# Patient Record
Sex: Male | Born: 1950 | Race: White | Hispanic: No | Marital: Married | Smoking: Never smoker
Health system: Southern US, Community
[De-identification: ages and names within clinical notes are randomized; demographics above are authoritative.]

## PROBLEM LIST (undated history)

## (undated) DIAGNOSIS — E7800 Pure hypercholesterolemia, unspecified: Secondary | ICD-10-CM

## (undated) DIAGNOSIS — K219 Gastro-esophageal reflux disease without esophagitis: Secondary | ICD-10-CM

---

## 2009-08-26 HISTORY — PX: AORTIC VALVE REPLACEMENT: SHX41

## 2014-05-14 ENCOUNTER — Emergency Department (HOSPITAL_BASED_OUTPATIENT_CLINIC_OR_DEPARTMENT_OTHER): Payer: Worker's Compensation

## 2014-05-14 ENCOUNTER — Encounter (HOSPITAL_BASED_OUTPATIENT_CLINIC_OR_DEPARTMENT_OTHER): Payer: Self-pay | Admitting: Emergency Medicine

## 2014-05-14 ENCOUNTER — Emergency Department (HOSPITAL_BASED_OUTPATIENT_CLINIC_OR_DEPARTMENT_OTHER)
Admission: EM | Admit: 2014-05-14 | Discharge: 2014-05-14 | Disposition: A | Discharge status: Home / Self Care | Payer: Worker's Compensation | Attending: Emergency Medicine | Admitting: Emergency Medicine

## 2014-05-14 DIAGNOSIS — Z7982 Long term (current) use of aspirin: Secondary | ICD-10-CM | POA: Insufficient documentation

## 2014-05-14 DIAGNOSIS — N509 Disorder of male genital organs, unspecified: Secondary | ICD-10-CM | POA: Insufficient documentation

## 2014-05-14 DIAGNOSIS — Z79899 Other long term (current) drug therapy: Secondary | ICD-10-CM | POA: Insufficient documentation

## 2014-05-14 DIAGNOSIS — Z862 Personal history of diseases of the blood and blood-forming organs and certain disorders involving the immune mechanism: Secondary | ICD-10-CM | POA: Diagnosis not present

## 2014-05-14 DIAGNOSIS — N50812 Left testicular pain: Secondary | ICD-10-CM

## 2014-05-14 DIAGNOSIS — R1031 Right lower quadrant pain: Secondary | ICD-10-CM | POA: Insufficient documentation

## 2014-05-14 DIAGNOSIS — Z8639 Personal history of other endocrine, nutritional and metabolic disease: Secondary | ICD-10-CM | POA: Insufficient documentation

## 2014-05-14 DIAGNOSIS — K219 Gastro-esophageal reflux disease without esophagitis: Secondary | ICD-10-CM | POA: Diagnosis not present

## 2014-05-14 HISTORY — DX: Gastro-esophageal reflux disease without esophagitis: K21.9

## 2014-05-14 HISTORY — DX: Pure hypercholesterolemia, unspecified: E78.00

## 2014-05-14 LAB — CBC WITH DIFFERENTIAL/PLATELET
BASOS ABS: 0.1 10*3/uL (ref 0.0–0.1)
Basophils Relative: 1 % (ref 0–1)
Eosinophils Absolute: 0.2 10*3/uL (ref 0.0–0.7)
Eosinophils Relative: 4 % (ref 0–5)
HCT: 40.8 % (ref 39.0–52.0)
Hemoglobin: 14.1 g/dL (ref 13.0–17.0)
LYMPHS PCT: 27 % (ref 12–46)
Lymphs Abs: 1.4 10*3/uL (ref 0.7–4.0)
MCH: 29.6 pg (ref 26.0–34.0)
MCHC: 34.6 g/dL (ref 30.0–36.0)
MCV: 85.5 fL (ref 78.0–100.0)
Monocytes Absolute: 0.6 10*3/uL (ref 0.1–1.0)
Monocytes Relative: 12 % (ref 3–12)
NEUTROS ABS: 2.8 10*3/uL (ref 1.7–7.7)
NEUTROS PCT: 56 % (ref 43–77)
PLATELETS: 192 10*3/uL (ref 150–400)
RBC: 4.77 MIL/uL (ref 4.22–5.81)
RDW: 12.6 % (ref 11.5–15.5)
WBC: 5.1 10*3/uL (ref 4.0–10.5)

## 2014-05-14 LAB — COMPREHENSIVE METABOLIC PANEL
ALT: 30 U/L (ref 0–53)
AST: 35 U/L (ref 0–37)
Albumin: 4.2 g/dL (ref 3.5–5.2)
Alkaline Phosphatase: 50 U/L (ref 39–117)
Anion gap: 12 (ref 5–15)
BILIRUBIN TOTAL: 0.3 mg/dL (ref 0.3–1.2)
BUN: 14 mg/dL (ref 6–23)
CALCIUM: 9.5 mg/dL (ref 8.4–10.5)
CHLORIDE: 100 meq/L (ref 96–112)
CO2: 27 meq/L (ref 19–32)
Creatinine, Ser: 1.3 mg/dL (ref 0.50–1.35)
GFR calc Af Amer: 66 mL/min — ABNORMAL LOW (ref >90)
GFR calc non Af Amer: 57 mL/min — ABNORMAL LOW (ref >90)
Glucose, Bld: 98 mg/dL (ref 70–99)
POTASSIUM: 4.6 meq/L (ref 3.7–5.3)
SODIUM: 139 meq/L (ref 137–147)
Total Protein: 7 g/dL (ref 6.0–8.3)

## 2014-05-14 LAB — URINALYSIS, ROUTINE W REFLEX MICROSCOPIC
Bilirubin Urine: NEGATIVE
Glucose, UA: NEGATIVE mg/dL
Hgb urine dipstick: NEGATIVE
Ketones, ur: NEGATIVE mg/dL
Leukocytes, UA: NEGATIVE
Nitrite: NEGATIVE
Protein, ur: NEGATIVE mg/dL
Specific Gravity, Urine: 1.016 (ref 1.005–1.030)
Urobilinogen, UA: 0.2 mg/dL (ref 0.0–1.0)
pH: 5.5 (ref 5.0–8.0)

## 2014-05-14 MED ORDER — MORPHINE SULFATE 4 MG/ML IJ SOLN
6.0000 mg | Freq: Once | INTRAMUSCULAR | Status: AC
  Administered 2014-05-14: 6 mg via INTRAMUSCULAR
  Filled 2014-05-14: qty 2

## 2014-05-14 MED ORDER — TRAMADOL HCL 50 MG PO TABS: 50.0000 mg | ORAL_TABLET | Freq: Four times a day (QID) | ORAL | Status: AC | PRN

## 2014-05-14 NOTE — ED Notes (Signed)
Patient transported to X-ray 

## 2014-05-14 NOTE — Discharge Instructions (Signed)
Abdominal Pain °Many things can cause abdominal pain. Usually, abdominal pain is not caused by a disease and will improve without treatment. It can often be observed and treated at home. Your health care provider will do a physical exam and possibly order blood tests and X-rays to help determine the seriousness of your pain. However, in many cases, more time must pass before a clear cause of the pain can be found. Before that point, your health care provider may not know if you need more testing or further treatment. °HOME CARE INSTRUCTIONS  °Monitor your abdominal pain for any changes. The following actions may help to alleviate any discomfort you are experiencing: °· Only take over-the-counter or prescription medicines as directed by your health care provider. °· Do not take laxatives unless directed to do so by your health care provider. °· Try a clear liquid diet (broth, tea, or water) as directed by your health care provider. Slowly move to a bland diet as tolerated. °SEEK MEDICAL CARE IF: °· You have unexplained abdominal pain. °· You have abdominal pain associated with nausea or diarrhea. °· You have pain when you urinate or have a bowel movement. °· You experience abdominal pain that wakes you in the night. °· You have abdominal pain that is worsened or improved by eating food. °· You have abdominal pain that is worsened with eating fatty foods. °· You have a fever. °SEEK IMMEDIATE MEDICAL CARE IF:  °· Your pain does not go away within 2 hours. °· You keep throwing up (vomiting). °· Your pain is felt only in portions of the abdomen, such as the right side or the left lower portion of the abdomen. °· You pass bloody or black tarry stools. °MAKE SURE YOU: °· Understand these instructions.   °· Will watch your condition.   °· Will get help right away if you are not doing well or get worse.   °Document Released: 05/22/2005 Document Revised: 08/17/2013 Document Reviewed: 04/21/2013 °[employer]® Patient Information  ©2015 [employer]. This information is not intended to replace advice given to you by your health care provider. Make sure you discuss any questions you have with your health care provider. ° °Pain of Unknown Etiology (Pain Without a Known Cause) °You have come to your caregiver because of pain. Pain can occur in any part of the body. Often there is not a definite cause. If your laboratory (blood or urine) work was normal and X-rays or other studies were normal, your caregiver may treat you without knowing the cause of the pain. An example of this is the headache. Most headaches are diagnosed by taking a history. This means your caregiver asks you questions about your headaches. Your caregiver determines a treatment based on your answers. Usually testing done for headaches is normal. Often testing is not done unless there is no response to medications. Regardless of where your pain is located today, you can be given medications to make you comfortable. If no physical cause of pain can be found, most cases of pain will gradually leave as suddenly as they came.  °If you have a painful condition and no reason can be found for the pain, it is important that you follow up with your caregiver. If the pain becomes worse or does not go away, it may be necessary to repeat tests and look further for a possible cause. °· Only take over-the-counter or prescription medicines for pain, discomfort, or fever as directed by your caregiver. °· For the protection of your privacy, test results   cannot be given over the phone. Make sure you receive the results of your test. Ask how these results are to be obtained if you have not been informed. It is your responsibility to obtain your test results. °· You may continue all activities unless the activities cause more pain. When the pain lessens, it is important to gradually resume normal activities. Resume activities by beginning slowly and gradually increasing the intensity and duration  of the activities or exercise. During periods of severe pain, bed rest may be helpful. Lie or sit in any position that is comfortable. °· Ice used for acute (sudden) conditions may be effective. Use a large plastic bag filled with ice and wrapped in a towel. This may provide pain relief. °· See your caregiver for continued problems. Your caregiver can help or refer you for exercises or physical therapy if necessary. °If you were given medications for your condition, do not drive, operate machinery or power tools, or sign legal documents for 24 hours. Do not drink alcohol, take sleeping pills, or take other medications that may interfere with treatment. °See your caregiver immediately if you have pain that is becoming worse and not relieved by medications. °Document Released: 05/07/2001 Document Revised: 06/02/2013 Document Reviewed: 08/12/2005 °[employer]® Patient Information ©2015 [employer]. This information is not intended to replace advice given to you by your health care provider. Make sure you discuss any questions you have with your health care provider. ° °

## 2014-05-14 NOTE — ED Notes (Signed)
Pt is back from [place], in bathroom to get urine sample

## 2014-05-14 NOTE — ED Notes (Signed)
Patient transported to Ultrasound 

## 2014-05-14 NOTE — ED Provider Notes (Signed)
CSN: [id number]     Arrival date & time 05/14/14  1400 History   First MD Initiated Contact with Patient 05/14/14 1457     Chief Complaint  Patient presents with  . Testicle Pain  . Abdominal Pain     (Consider location/radiation/quality/duration/timing/severity/associated sxs/prior Treatment) Patient is a 63 y.o. male presenting with testicular pain and abdominal pain. The history is provided by the patient. No language interpreter was used.  Testicle Pain This is a new problem. The current episode started 12 to 24 hours ago. The problem occurs constantly. The problem has been gradually worsening. Associated symptoms include abdominal pain. Pertinent negatives include no chest pain, no headaches and no shortness of breath. Nothing aggravates the symptoms. Nothing relieves the symptoms. He has tried nothing for the symptoms. The treatment provided no relief.  Abdominal Pain Pain location:  RLQ Pain quality: aching   Pain radiates to:  Does not radiate Pain severity:  Moderate Onset quality:  Sudden Duration:  19 hours Timing:  Constant Progression:  Unchanged Chronicity:  New Associated symptoms: no chest pain, no constipation, no cough, no diarrhea, no dysuria, no fatigue, no fever, no nausea, no shortness of breath and no vomiting     Past Medical History  Diagnosis Date  . GERD (gastroesophageal reflux disease)   . Hypercholesteremia    Past Surgical History  Procedure Laterality Date  . Aortic valve replacement  2011   History reviewed. No pertinent family history. History  Substance Use Topics  . Smoking status: Never Smoker   . Smokeless tobacco: Not on file  . Alcohol Use: No    Review of Systems  Constitutional: Negative for fever, activity change, appetite change and fatigue.  HENT: Negative for congestion, facial swelling, rhinorrhea and trouble swallowing.   Eyes: Negative for photophobia and pain.  Respiratory: Negative for cough, chest tightness and  shortness of breath.   Cardiovascular: Negative for chest pain and leg swelling.  Gastrointestinal: Positive for abdominal pain. Negative for nausea, vomiting, diarrhea and constipation.  Endocrine: Negative for polydipsia and polyuria.  Genitourinary: Positive for testicular pain. Negative for dysuria, urgency, decreased urine volume and difficulty urinating.  Musculoskeletal: Negative for back pain and gait problem.  Skin: Negative for color change, rash and wound.  Allergic/Immunologic: Negative for immunocompromised state.  Neurological: Negative for dizziness, facial asymmetry, speech difficulty, weakness, numbness and headaches.  Psychiatric/Behavioral: Negative for confusion, decreased concentration and agitation.      Allergies  Hydrocodone  Home Medications   Prior to Admission medications   Medication Sig Start Date End Date Taking? Authorizing Provider  acyclovir (ZOVIRAX) 200 MG capsule Take 200 mg by mouth daily.   Yes Historical Provider, MD  aspirin 81 MG tablet Take 81 mg by mouth daily.   Yes Historical Provider, MD  omeprazole (PRILOSEC) 20 MG capsule Take 20 mg by mouth daily.   Yes Historical Provider, MD  traMADol (ULTRAM) 50 MG tablet Take 1 tablet (50 mg total) by mouth every 6 (six) hours as needed. 05/14/14   Toy Cookey, MD   BP 120/73  Pulse 98  Temp(Src) 98 F (36.7 C) (Oral)  Resp 18  Ht  (1.702 m)  Wt 185 lb (83.915 kg)  BMI 28.97 kg/m2  SpO2 100% Physical Exam  Constitutional: He is oriented to person, place, and time. He appears well-developed and well-nourished. No distress.  HENT:  Head: Normocephalic and atraumatic.  Mouth/Throat: No oropharyngeal exudate.  Eyes: Pupils are equal, round, and reactive to light.  Neck: Normal range of motion. Neck supple.  Cardiovascular: Normal rate, regular rhythm and normal heart sounds.  Exam reveals no gallop and no friction rub.   No murmur heard. Pulmonary/Chest: Effort normal and breath  sounds normal. No respiratory distress. He has no wheezes. He has no rales.  Abdominal: Soft. Bowel sounds are normal. He exhibits no distension and no mass. There is tenderness in the right lower quadrant. There is no rigidity, no rebound and no guarding. Hernia confirmed negative in the right inguinal area and confirmed negative in the left inguinal area.  Genitourinary:    Left testis shows tenderness. Left testis shows no mass and no swelling. Left testis is descended. Cremasteric reflex is not absent on the left side. Circumcised.  Somewhat horizontal lie to L testicle  Musculoskeletal: Normal range of motion. He exhibits no edema and no tenderness.  Lymphadenopathy:       Right: No inguinal adenopathy present.       Left: No inguinal adenopathy present.  Neurological: He is alert and oriented to person, place, and time.  Skin: Skin is warm and dry.  Psychiatric: He has a normal mood and affect.    ED Course  Procedures (including critical care time) Labs Review Labs Reviewed  COMPREHENSIVE METABOLIC PANEL - Abnormal; Notable for the following:    GFR calc non Af Amer 57 (*)    GFR calc Af Amer 66 (*)    All other components within normal limits  URINE CULTURE  CBC WITH DIFFERENTIAL  URINALYSIS, ROUTINE W REFLEX MICROSCOPIC    Imaging Review US Scrotum  05/14/2014   CLINICAL DATA:  Right groin pain following lifting last night. Pain has worsened since last night. Patient reports left epididymis removed in 2006.  EXAM: SCROTAL ULTRASOUND  DOPPLER ULTRASOUND OF THE TESTICLES  TECHNIQUE: Complete ultrasound examination of the testicles, epididymis, and other scrotal structures was performed. Color and spectral Doppler ultrasound were also utilized to evaluate blood flow to the testicles.  COMPARISON:  None.  FINDINGS: Right testicle  Measurements: 4.7 cm x 2.2 cm x 3.2 cm. No mass or microlithiasis visualized.  Left testicle  Measurements: 4.0 cm x 1.9 cm x 3.2 cm. Mildly  heterogeneous echotexture. Mildly prominent mediastinum. No mass. Area of relative hypo echogenicity posteriorly, nonspecific. This may be related to the remote trauma.  Right epididymis: 9 mm epididymal head cyst. Probable epididymal appendix reflected by 7 mm echogenic focus lateral to the testicle.  Left epididymis:  Surgically removed.  Hydrocele:  Moderate right and small left hydroceles.  Varicocele:  None visualized.  Pulsed Doppler interrogation of both testes demonstrates low resistance arterial and venous waveforms bilaterally.  Sonographic evaluation of the inguinal regions, utilizing Valsalva, shows no evidence of a hernia.  IMPRESSION: 1. No acute finding. 2. No evidence of a testicular mass, torsion or of epididymitis/orchitis. 3. No evidence of an inguinal hernia. 4. Area of ill-defined hypo echogenicity along the posterior margin of the left testicle. This may be the consequence of the remote trauma. Patient had his left epididymis removed as the results of this trauma. 5. Small right epididymal head cyst. 6. Moderate right and small left hydroceles.   Electronically Signed   By: Amie Portland M.D.   On: 05/14/2014 16:29   [place] Art/ven Flow Abd Pelv Doppler  05/14/2014   CLINICAL DATA:  Right groin pain following lifting last night. Pain has worsened since last night. Patient reports left epididymis removed in 2006.  EXAM: SCROTAL ULTRASOUND  DOPPLER  ULTRASOUND OF THE TESTICLES  TECHNIQUE: Complete ultrasound examination of the testicles, epididymis, and other scrotal structures was performed. Color and spectral Doppler ultrasound were also utilized to evaluate blood flow to the testicles.  COMPARISON:  None.  FINDINGS: Right testicle  Measurements: 4.7 cm x 2.2 cm x 3.2 cm. No mass or microlithiasis visualized.  Left testicle  Measurements: 4.0 cm x 1.9 cm x 3.2 cm. Mildly heterogeneous echotexture. Mildly prominent mediastinum. No mass. Area of relative hypo echogenicity posteriorly, nonspecific.  This may be related to the remote trauma.  Right epididymis: 9 mm epididymal head cyst. Probable epididymal appendix reflected by 7 mm echogenic focus lateral to the testicle.  Left epididymis:  Surgically removed.  Hydrocele:  Moderate right and small left hydroceles.  Varicocele:  None visualized.  Pulsed Doppler interrogation of both testes demonstrates low resistance arterial and venous waveforms bilaterally.  Sonographic evaluation of the inguinal regions, utilizing Valsalva, shows no evidence of a hernia.  IMPRESSION: 1. No acute finding. 2. No evidence of a testicular mass, torsion or of epididymitis/orchitis. 3. No evidence of an inguinal hernia. 4. Area of ill-defined hypo echogenicity along the posterior margin of the left testicle. This may be the consequence of the remote trauma. Patient had his left epididymis removed as the results of this trauma. 5. Small right epididymal head cyst. 6. Moderate right and small left hydroceles.   Electronically Signed   By: Amie Portland M.D.   On: 05/14/2014 16:29   Ct Renal Stone Study  05/14/2014   CLINICAL DATA:  Worsening right lower quadrant pain.  EXAM: CT ABDOMEN AND PELVIS WITHOUT CONTRAST  TECHNIQUE: Multidetector CT imaging of the abdomen and pelvis was performed following the standard protocol without IV contrast.  COMPARISON:  None.  FINDINGS: Visualization of the lower thorax demonstrates dependent atelectasis. Normal heart size.  Lack of intravenous contrast material limits evaluation the solid organ parenchyma.  Sub cm low-attenuation lesion within the left hepatic lobe (image 22; series 2), too small to accurately characterize. Gallbladder, spleen, pancreas and bilateral adrenal glands are unremarkable. Kidneys are symmetric in size. No hydronephrosis. No nephroureterolithiasis.  Normal caliber abdominal aorta. No retroperitoneal lymphadenopathy. Prostate is unremarkable.  No abnormal bowel wall thickening. No evidence for bowel obstruction. The  appendix is not identified. There is no right lower quadrant inflammatory stranding. No free fluid or free intraperitoneal air.  Lower lumbar spine degenerative change particularly at the L4-5 L5-S1 facets.  IMPRESSION: No cause for acute abdominal pain identified.  The appendix is not well identified however there is no right lower quadrant inflammatory stranding.   Electronically Signed   By: Annia Belt M.D.   On: 05/14/2014 19:28     EKG Interpretation None      MDM   Final diagnoses:  Testicular pain, left  RLQ abdominal pain    Pt is a 63 y.o. male with Pmhx as above who presents with RLQ pain since lifting a heavy object around 8pm yesterday, then woke up w/ severe L sided testicular pain around 4am this morning without appreciable redness/swelling. On PE, VSS, pt in NAD. +rlq ttp w/o rebound or guarding (surg hx of appendectomy). No hernias. +L testicular pain with somewhat horizontal lie, but no swelling, nml cremasteric reflexes BL. [place] and CT abdomen pelvis without contrast are both negative for acute findings Explaining Patient's pain in the scrotum or the right lower quadrant.  CBC, CMP, UA is also reassuring. I doubt acute surgical emergency. Pain may be musculoskeletal related  to his recent heavy lifting. Bili is safe to be discharged home trial supportive care repair tramadol and Tylenol. Return precautions given for new worsening symptoms including worsening pain, fever, swelling, inability to tolerate by mouth. He will followup with his PCP later this week        Toy Cookey, MD 05/14/14 [phone]

## 2014-05-14 NOTE — ED Notes (Signed)
Pt states he lifted something heavy last night and felt pain in his RLQ and in his L testicle that worsened overnight; reports nausea; denies fever, vomiting, groin swelling.

## 2014-05-15 LAB — URINE CULTURE
Colony Count: NO GROWTH
Culture: NO GROWTH

## 2016-02-15 IMAGING — CT CT RENAL STONE PROTOCOL
2 of 4 series · 17 of 46 positions shown, 19 images · non-contrast
Comparison: None.

CLINICAL DATA: Worsening right lower quadrant pain.

EXAM:
CT ABDOMEN AND PELVIS WITHOUT CONTRAST
TECHNIQUE: Multidetector CT imaging of the abdomen and pelvis was performed
following the standard protocol without IV contrast.

[Series 2: renal stone > 200 lbs 5.0 b31f · axial · 0.84mm/px · z∈[+603,+1058]mm · 14 of 101 slices shown, 16 images]
[im 5/101  soft-tissue]
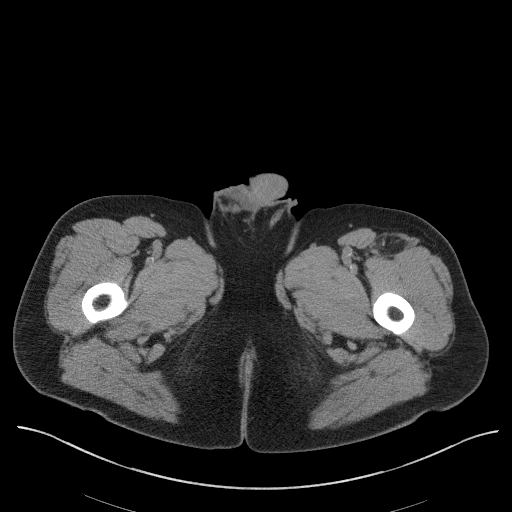
[im 5/101  bone]
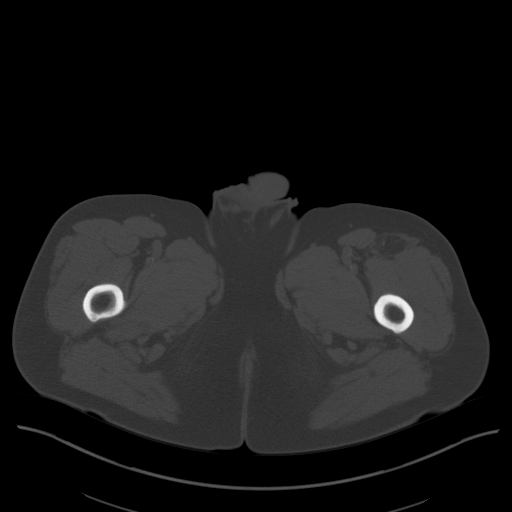
[im 13/101  soft-tissue]
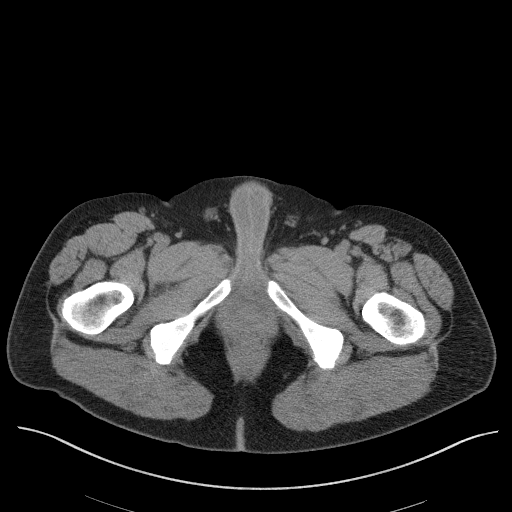
[im 21/101  soft-tissue]
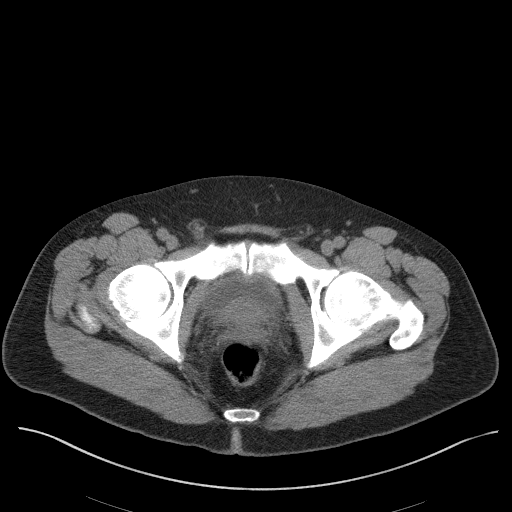
[im 26/101  soft-tissue]
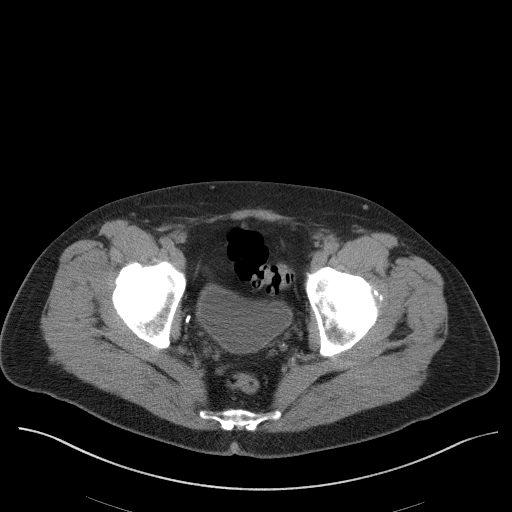
[im 34/101  soft-tissue]
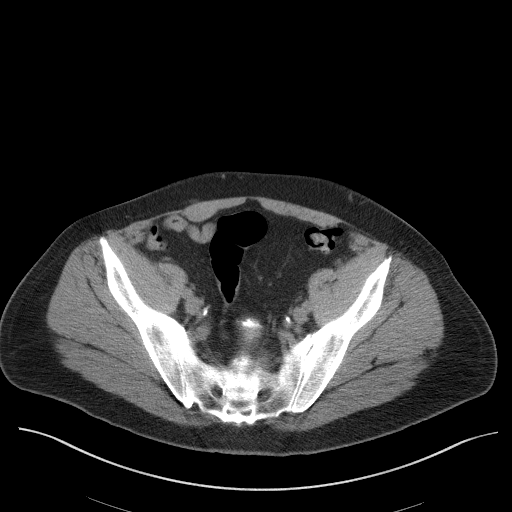
[im 42/101  soft-tissue]
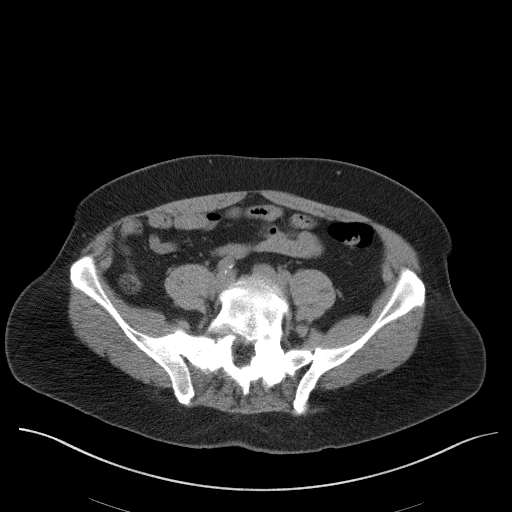
[im 46/101  soft-tissue]
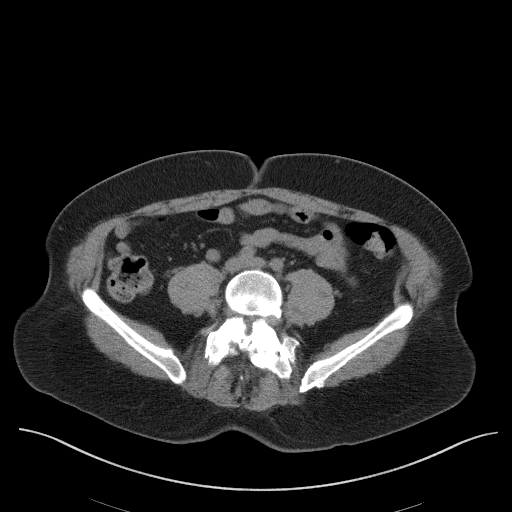
[im 55/101  soft-tissue]
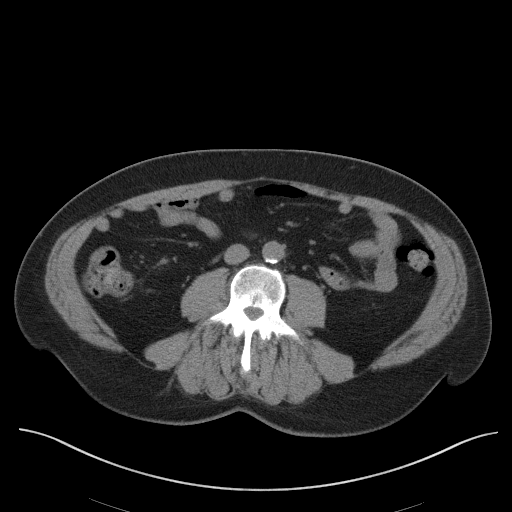
[im 59/101  soft-tissue]
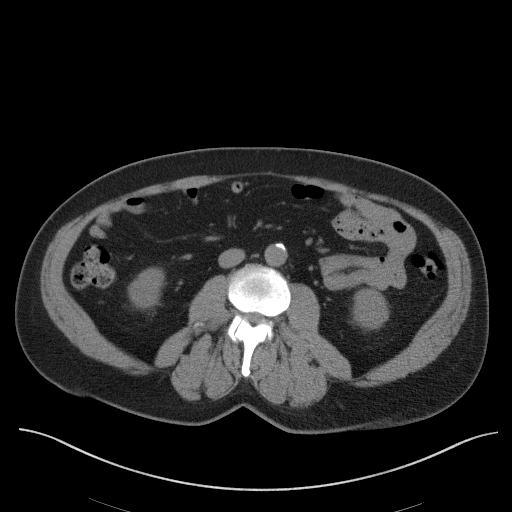
[im 59/101  bone]
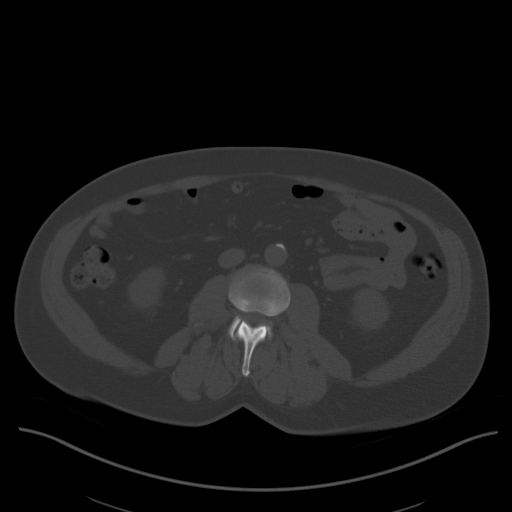
[im 67/101  soft-tissue]
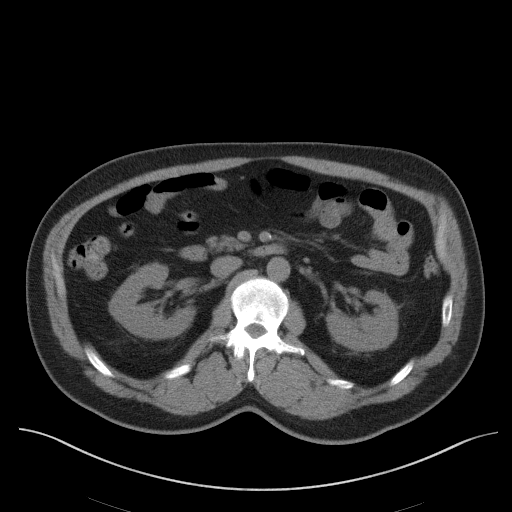
[im 76/101  soft-tissue]
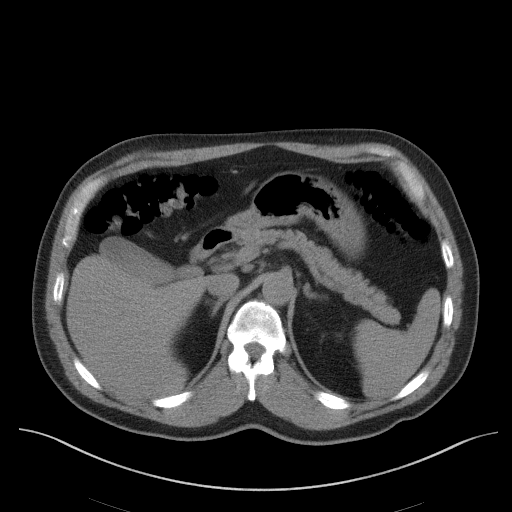
[im 80/101  soft-tissue]
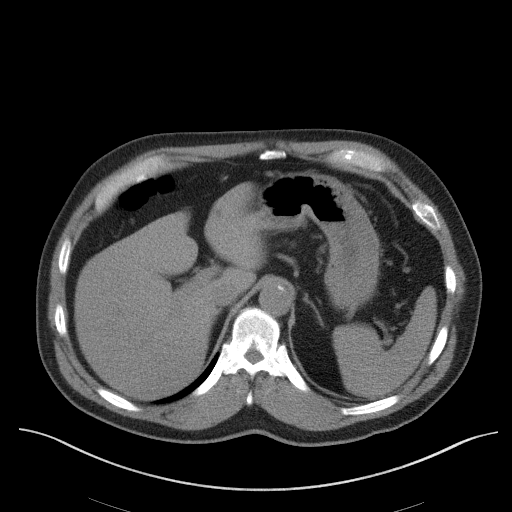
[im 88/101  soft-tissue]
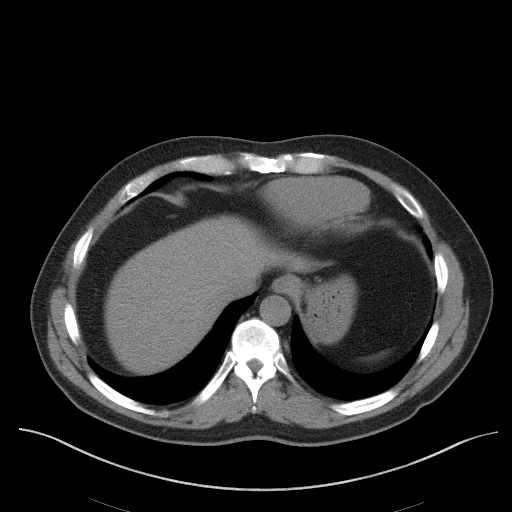
[im 96/101  soft-tissue]
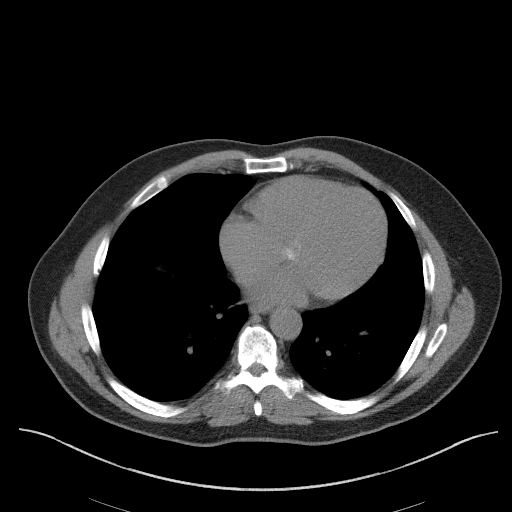

[Series 5: renal stone 3.0 coronal · coronal · 0.91mm/px · 3 of 92 slices shown]
[im 31/92  soft-tissue]
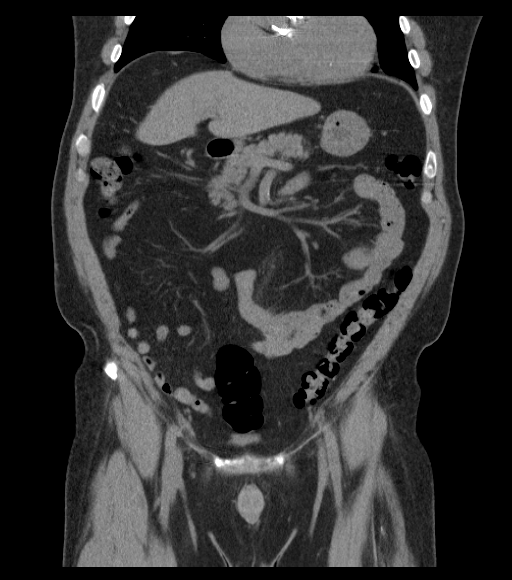
[im 41/92  soft-tissue]
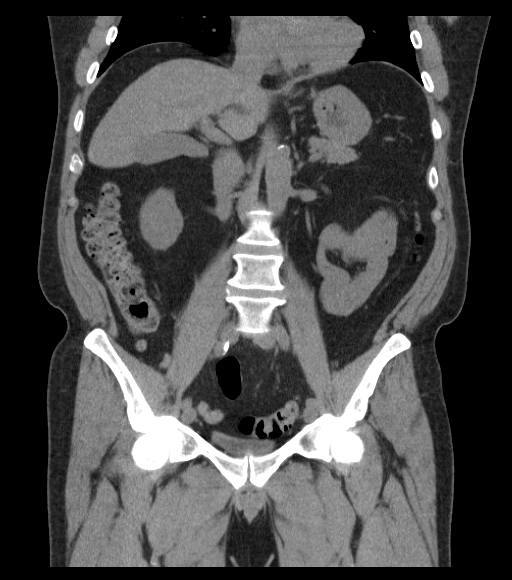
[im 51/92  soft-tissue]
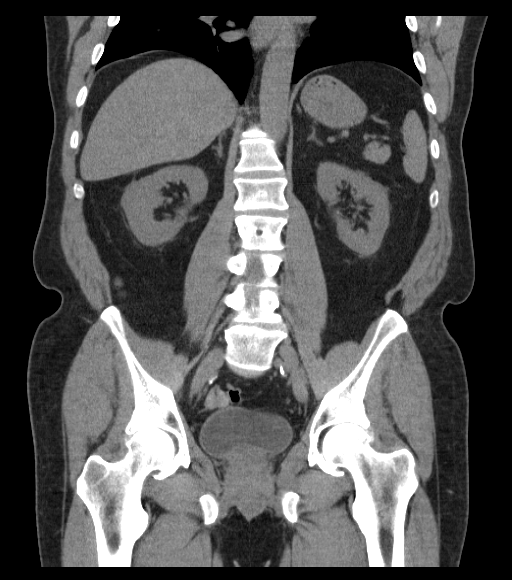

[17 of 46 positions shown; findings below may reference images not displayed]

FINDINGS: Visualization of the lower thorax demonstrates dependent
atelectasis. Normal heart size.

Lack of intravenous contrast material limits evaluation the solid
organ parenchyma.

Sub cm low-attenuation lesion within the left hepatic lobe (image
22; series 2), too small to accurately characterize. Gallbladder,
spleen, pancreas and bilateral adrenal glands are unremarkable.
Kidneys are symmetric in size. No hydronephrosis. No
nephroureterolithiasis.

Normal caliber abdominal aorta. No retroperitoneal lymphadenopathy.
Prostate is unremarkable.

No abnormal bowel wall thickening. No evidence for bowel
obstruction. The appendix is not identified. There is no right lower
quadrant inflammatory stranding. No free fluid or free
intraperitoneal air.

Lower lumbar spine degenerative change particularly at the L4-5
L5-S1 facets.
IMPRESSION: No cause for acute abdominal pain identified.

The appendix is not well identified however there is no right lower
quadrant inflammatory stranding.

## 2017-07-26 DEATH — deceased
